# Patient Record
Sex: Male | Born: 1965 | Race: White | Hispanic: No | Marital: Married | Smoking: Current every day smoker
Health system: Southern US, Community
[De-identification: ages and names within clinical notes are randomized; demographics above are authoritative.]

## PROBLEM LIST (undated history)

## (undated) DIAGNOSIS — E119 Type 2 diabetes mellitus without complications: Secondary | ICD-10-CM

---

## 2007-10-09 ENCOUNTER — Ambulatory Visit: Payer: Self-pay | Admitting: Internal Medicine

## 2009-07-08 ENCOUNTER — Ambulatory Visit: Payer: Self-pay | Admitting: Family Medicine

## 2010-02-28 ENCOUNTER — Ambulatory Visit: Payer: Self-pay | Admitting: Internal Medicine

## 2010-09-26 ENCOUNTER — Ambulatory Visit: Payer: Self-pay | Admitting: Internal Medicine

## 2011-02-15 DIAGNOSIS — F32A Depression, unspecified: Secondary | ICD-10-CM | POA: Insufficient documentation

## 2011-02-15 DIAGNOSIS — F17200 Nicotine dependence, unspecified, uncomplicated: Secondary | ICD-10-CM | POA: Insufficient documentation

## 2011-03-02 DIAGNOSIS — K2270 Barrett's esophagus without dysplasia: Secondary | ICD-10-CM | POA: Insufficient documentation

## 2011-08-24 DIAGNOSIS — M549 Dorsalgia, unspecified: Secondary | ICD-10-CM | POA: Insufficient documentation

## 2011-08-29 IMAGING — US US EXTREM LOW VENOUS*L*
1 series · 17 of 24 positions shown · non-contrast
Comparison: none

REASON FOR EXAM: cr  [id number]  left leg pain and swelling eval for DVT
COMMENTS:

PROCEDURE:     [facility] LT LEG EXTR  - February 28, 2010  [DATE]
RESULT:     Comparison: None

[Series 1: us extrem low venous*left* · 17 of 28 slices shown]
[im 1/28]
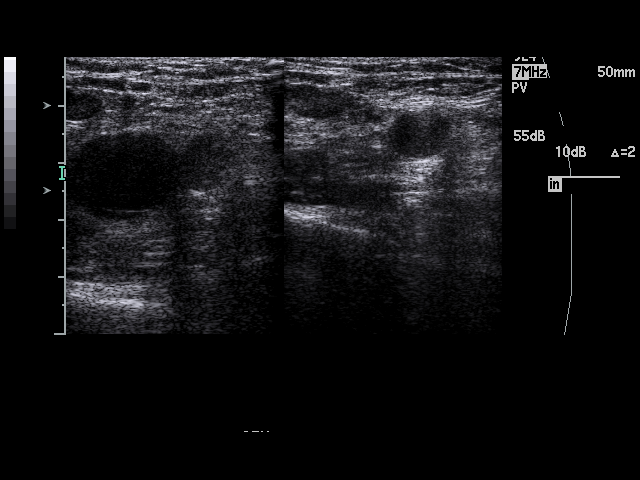
[im 3/28]
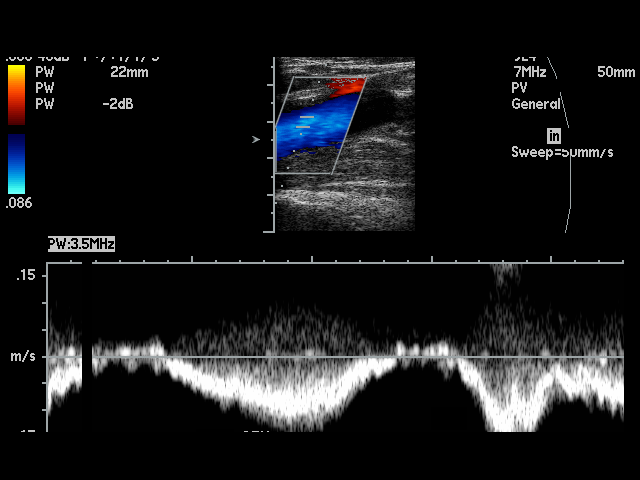
[im 4/28]
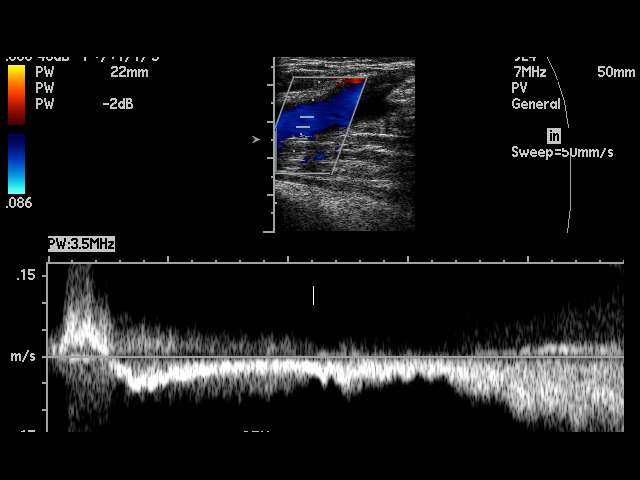
[im 5/28]
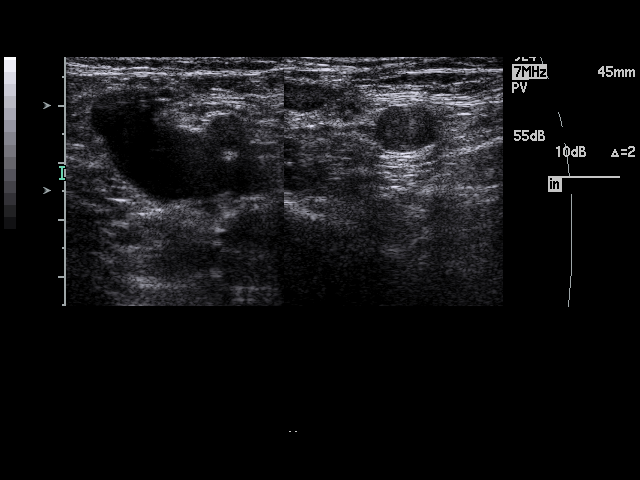
[im 8/28]
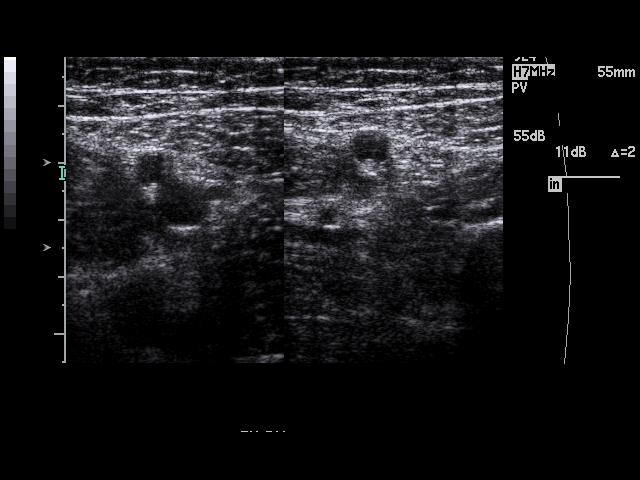
[im 9/28]
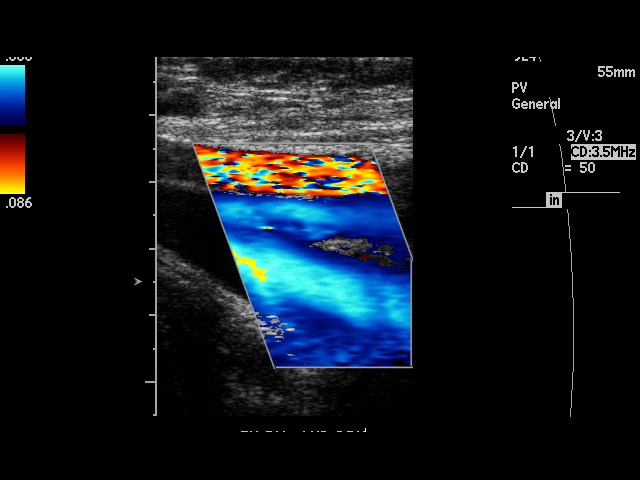
[im 11/28]
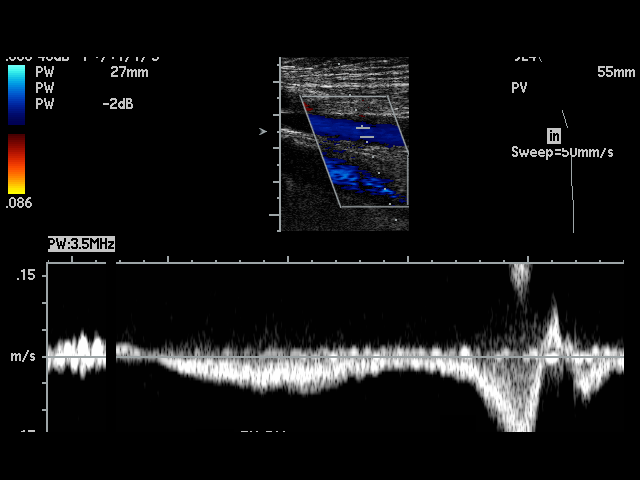
[im 12/28]
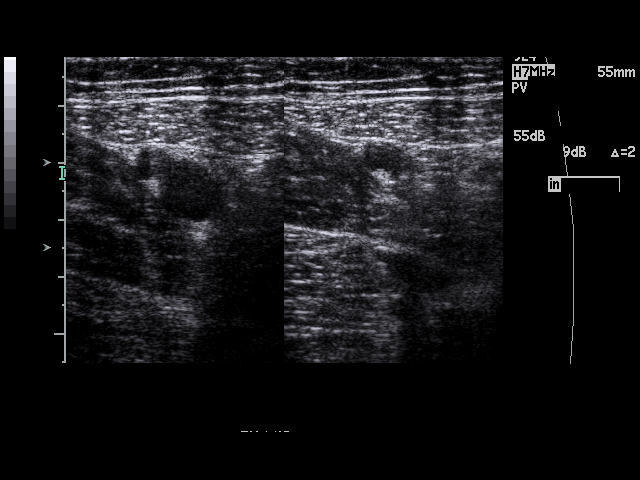
[im 15/28]
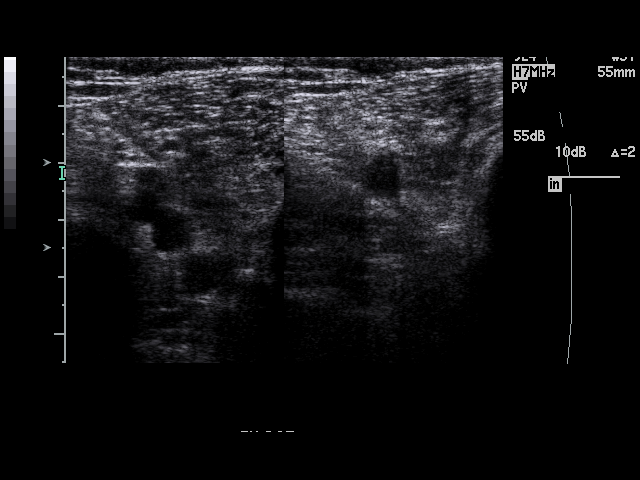
[im 16/28]
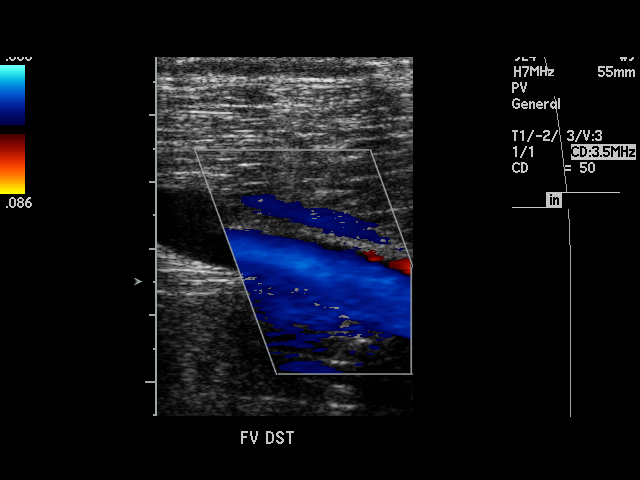
[im 17/28]
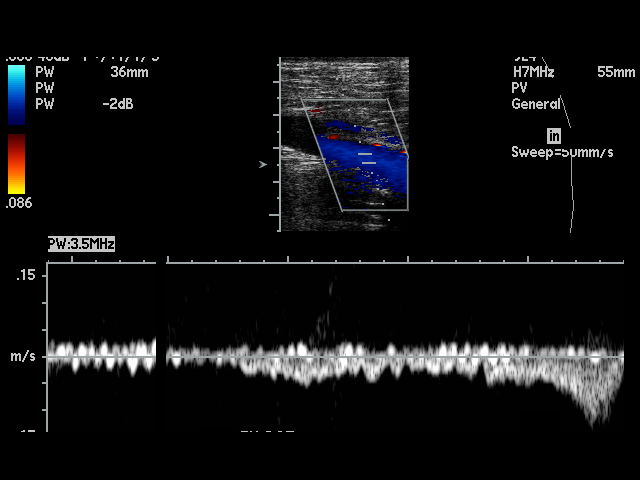
[im 19/28]
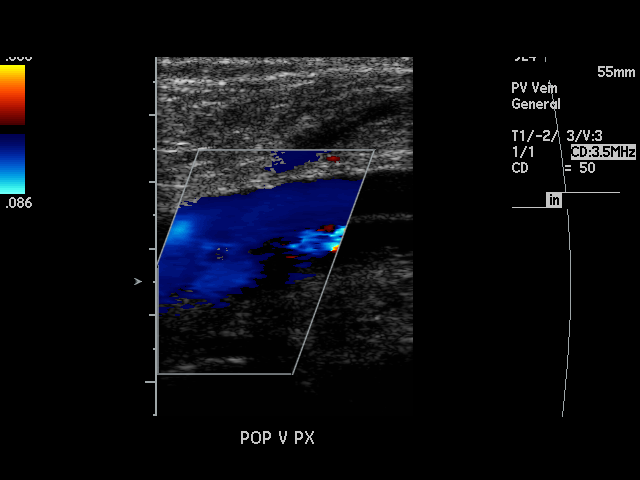
[im 20/28]
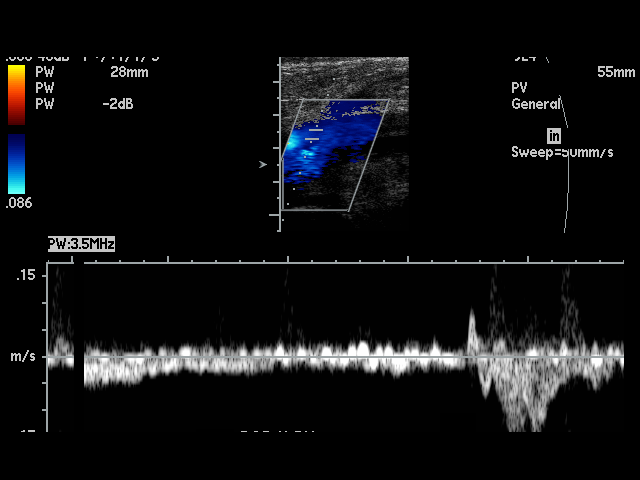
[im 23/28]
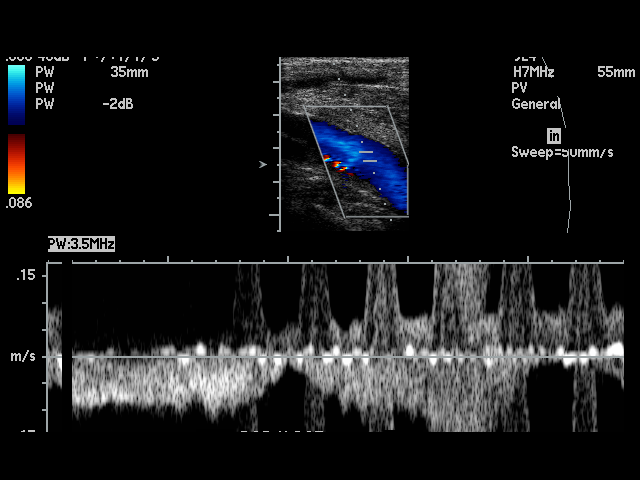
[im 24/28]
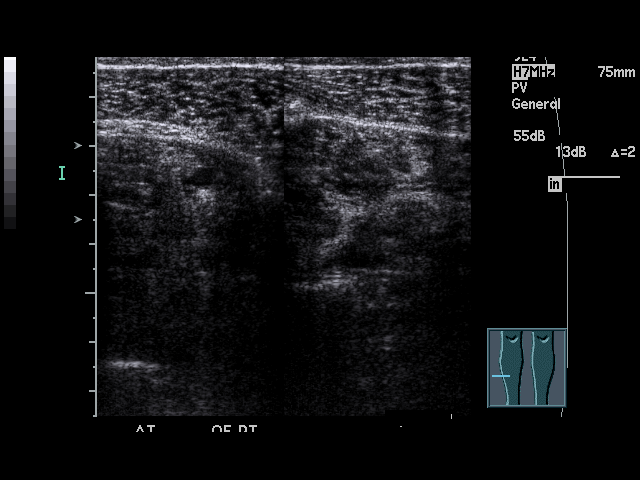
[im 25/28]
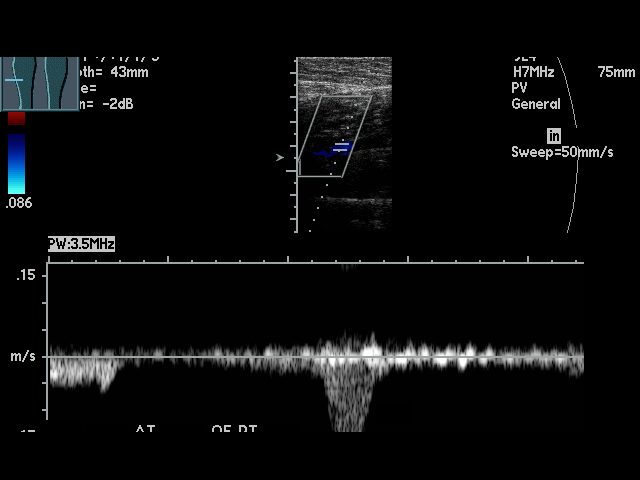
[im 28/28]
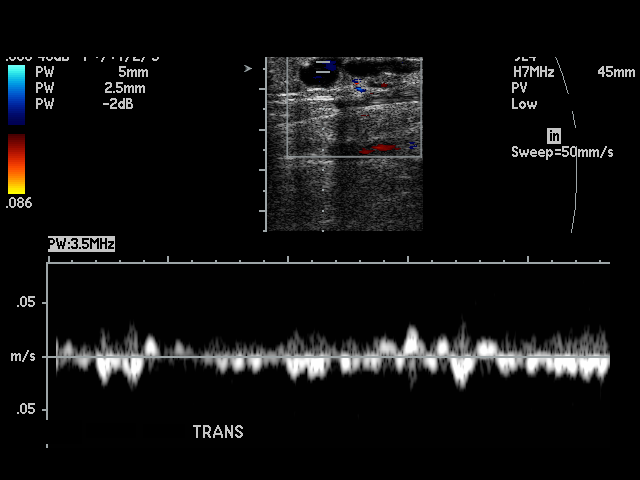

[17 of 24 positions shown; findings below may reference images not displayed]

FINDINGS: Multiple longitudinal and transverse gray-scale as well as color
and spectral Doppler images of the left lower extremity veins were obtained
from the common femoral veins through the popliteal veins.

The left common femoral, greater saphenous, femoral, popliteal veins, and
venous trifurcation are patent, demonstrating normal color-flow and
compressibility. No intraluminal thrombus is identified.There is normal
respiratory variation and augmentation demonstrated at all vein levels.
IMPRESSION: No evidence of DVT in the left lower extremity.

## 2011-09-23 ENCOUNTER — Ambulatory Visit: Payer: Self-pay | Admitting: Family Medicine

## 2013-04-05 DIAGNOSIS — G629 Polyneuropathy, unspecified: Secondary | ICD-10-CM | POA: Insufficient documentation

## 2014-07-13 ENCOUNTER — Ambulatory Visit: Payer: Self-pay | Admitting: Family Medicine

## 2017-01-21 ENCOUNTER — Encounter: Payer: Self-pay | Admitting: *Deleted

## 2017-01-21 ENCOUNTER — Ambulatory Visit
Admission: EM | Admit: 2017-01-21 | Discharge: 2017-01-21 | Disposition: A | Discharge status: Home / Self Care | Payer: Medicare Other | Attending: Family Medicine | Admitting: Family Medicine

## 2017-01-21 DIAGNOSIS — L0291 Cutaneous abscess, unspecified: Secondary | ICD-10-CM | POA: Diagnosis not present

## 2017-01-21 DIAGNOSIS — L0501 Pilonidal cyst with abscess: Secondary | ICD-10-CM

## 2017-01-21 DIAGNOSIS — L0231 Cutaneous abscess of buttock: Secondary | ICD-10-CM

## 2017-01-21 HISTORY — DX: Type 2 diabetes mellitus without complications: E11.9

## 2017-01-21 MED ORDER — MUPIROCIN 2 % EX OINT: 1.0000 "application " | TOPICAL_OINTMENT | Freq: Two times a day (BID) | CUTANEOUS | 0 refills | Status: AC

## 2017-01-21 MED ORDER — SULFAMETHOXAZOLE-TRIMETHOPRIM 800-160 MG PO TABS: 1.0000 | ORAL_TABLET | Freq: Two times a day (BID) | ORAL | 0 refills | Status: AC

## 2017-01-21 NOTE — ED Triage Notes (Signed)
Multiple abcesses to buttocks region x1 week.

## 2017-01-21 NOTE — ED Provider Notes (Signed)
[facility]    CSN: [id number] Arrival date & time: 01/21/17  0807     History   Chief Complaint Chief Complaint  Patient presents with  . Abscess    HPI Jowel Waltner II is a 51 y.o. male.   Patient is a 51 year old white male with history of diabetes type. He has informed me that he has multiple abscesses on his buttocks he was seen here before the abscess there are things well and draining. He states she's not sleeping well at night because of the abscess is keeping him up because the pain and discomfort. When asked how long the abscess up in their he reports having multiple some of been there for several months some for several weeks and one these particular concern about his been there for about a week. He denies any other medical problems states doesn't have increasing medical issues. Should be noted that his chart was reviewed he has a history of type 1 diabetes she finally did admit that he does have please also had issues of noncompliance apparently according to his chart and hospitalizations for this uncomplicated type 1 diabetes. Which is totally also has neuropathy as well. His current day smoker no known drug allergies. Going to him no pertinent family medical history this time.   The history is provided by the patient. No language interpreter was used.  Abscess  Location:  Pelvis Pelvic abscess location:  L buttock, R buttock, gluteal cleft and perianal Abscess quality: draining, induration and redness   Progression:  Worsening Chronicity:  New Context: diabetes   Relieved by:  Nothing Worsened by:  Nothing Ineffective treatments:  None tried   Past Medical History:  Diagnosis Date  . Diabetes mellitus without complication (HCC)     There are no active problems to display for this patient.   History reviewed. No pertinent surgical history.     Home Medications    Prior to Admission medications   Medication Sig Start Date End Date  Taking? Authorizing Provider  insulin glargine (LANTUS) 100 UNIT/ML injection Inject into the skin at bedtime.   Yes [provider]  insulin regular (NOVOLIN R,HUMULIN R) 100 units/mL injection Inject into the skin 3 (three) times daily before meals.   Yes [provider]  mupirocin ointment (BACTROBAN) 2 % Apply 1 application topically 2 (two) times daily. 01/21/17   Hassan Rowan, MD  sulfamethoxazole-trimethoprim (BACTRIM DS,SEPTRA DS) 800-160 MG tablet Take 1 tablet by mouth 2 (two) times daily. 01/21/17   Hassan Rowan, MD    Family History History reviewed. No pertinent family history.  Social History Social History  Substance Use Topics  . Smoking status: Current Every Day Smoker  . Smokeless tobacco: Never Used  . Alcohol use Yes     Allergies   Patient has no known allergies.   Review of Systems Review of Systems  Skin: Positive for wound.  All other systems reviewed and are negative.    Physical Exam Triage Vital Signs ED Triage Vitals  Enc Vitals Group     BP 01/21/17 0818 129/80     Pulse Rate 01/21/17 0818 90     Resp 01/21/17 0818 16     Temp 01/21/17 0818 (!) 97.5 F (36.4 C)     Temp Source 01/21/17 0818 Oral     SpO2 01/21/17 0818 100 %     Weight 01/21/17 0821 180 lb (81.6 kg)     Height 01/21/17 0821  (1.905 m)  Head Circumference --      Peak Flow --      Pain Score 01/21/17 0822 10     Pain Loc --      Pain Edu? --      Excl. in GC? --    No data found.   Updated Vital Signs BP 129/80 (BP Location: Left Arm)   Pulse 90   Temp (!) 97.5 F (36.4 C) (Oral)   Resp 16   Ht  (1.905 m)   Wt 180 lb (81.6 kg)   SpO2 100%   BMI 22.50 kg/m   Visual Acuity Right Eye Distance:   Left Eye Distance:   Bilateral Distance:    Right Eye Near:   Left Eye Near:    Bilateral Near:     Physical Exam  Constitutional: He is oriented to person, place, and time. He appears well-developed and well-nourished.  HENT:    Head: Normocephalic and atraumatic.  Eyes: Pupils are equal, round, and reactive to light. EOM are normal.  Neck: Normal range of motion.  Pulmonary/Chest: Effort normal.  Musculoskeletal: Normal range of motion.  Neurological: He is alert and oriented to person, place, and time.  Skin: Lesion noted. There is erythema.     Patient with multiple abscesses on his buttocks one on the right lower cheek that appears to be draining even states that he can squeezes stuff comes out the abscess that he's most concerned about an most upset about his want this on the tailbone between both buttocks. This one is red there is a purulent discharge coming from it and is definitely open and draining.  Psychiatric: His mood appears anxious.  Vitals reviewed.    UC Treatments / Results  Labs (all labs ordered are listed, but only abnormal results are displayed) Labs Reviewed - No data to display  EKG  EKG Interpretation None       Radiology No results found.  Procedures Procedures (including critical care time)  Medications Ordered in UC Medications - No data to display   Initial Impression / Assessment and Plan / UC Course  I have reviewed the triage vital signs and the nursing notes.  Pertinent labs & imaging results that were available during my care of the patient were reviewed by me and considered in my medical decision making (see chart for details).    Explained to him concerned that he has a pollen out abscess this is the abscess that he really wants to have the open up and drain is really nothing at this point time to open since his draining is not fluctuant. Mildly concerned though courses of this may be much deeper and apparently is and recommend surgical referral. He has informed me he does not want to see a surgeon surge tech on doing anything for him he wants something done today she is going to [place] for this weekend. Even though this is been going on for about anywhere  from a month to a couple weeks he feels it has been done today and his son go to the ED of his choice to get something done   [facility] call in a prescription for Septra DS and Bactroban ointment Final Clinical Impressions(s) / UC Diagnoses   Final diagnoses:  Pilonidal abscess  Abscess  Abscess of buttock, right    New Prescriptions New Prescriptions   MUPIROCIN OINTMENT (BACTROBAN) 2 %    Apply 1 application topically 2 (two) times daily.   SULFAMETHOXAZOLE-TRIMETHOPRIM (BACTRIM DS,SEPTRA DS)  800-160 MG TABLET    Take 1 tablet by mouth 2 (two) times daily.   Note: This dictation was prepared with Dragon dictation along with smaller phrase technology. Any transcriptional errors that result from this process are unintentional.  Controlled Substance Prescriptions [place] Controlled Substance Registry consulted? Not Applicable   Hassan Rowan, MD 01/21/17 [id number]

## 2017-07-28 DIAGNOSIS — E1121 Type 2 diabetes mellitus with diabetic nephropathy: Secondary | ICD-10-CM | POA: Insufficient documentation

## 2017-07-28 DIAGNOSIS — E1021 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: Secondary | ICD-10-CM | POA: Insufficient documentation

## 2021-12-18 DIAGNOSIS — Z89431 Acquired absence of right foot: Secondary | ICD-10-CM | POA: Insufficient documentation

## 2021-12-26 DIAGNOSIS — K219 Gastro-esophageal reflux disease without esophagitis: Secondary | ICD-10-CM | POA: Insufficient documentation

## 2022-01-29 DIAGNOSIS — Z89512 Acquired absence of left leg below knee: Secondary | ICD-10-CM | POA: Insufficient documentation

## 2022-11-05 DIAGNOSIS — D649 Anemia, unspecified: Secondary | ICD-10-CM | POA: Insufficient documentation

## 2023-11-01 DIAGNOSIS — M21941 Unspecified acquired deformity of hand, right hand: Secondary | ICD-10-CM | POA: Insufficient documentation

## 2024-01-26 DIAGNOSIS — I10 Essential (primary) hypertension: Secondary | ICD-10-CM | POA: Insufficient documentation

## 2024-02-15 DIAGNOSIS — N186 End stage renal disease: Secondary | ICD-10-CM | POA: Insufficient documentation

## 2024-02-15 NOTE — Discharge Summary (Signed)
 [facility] Medicine Discharge Summary  Admit Date: 02/07/2024 Discharge Date: 02/15/2024  2:31 PM  Admitting Physician: Gara Door, MD Discharge Physician: Gordy Code, MD  Primary Care Provider: None (Inactive), Phone None  Discharge Destination: Home  Admission Diagnoses:  Hypertensive emergency [I16.1] Chest pressure [R07.89]  Discharge Diagnoses:  Principal Problem (Resolved):   NSTEMI (non-ST elevated myocardial infarction) (CMS/HHS-HCC) Active Problems:   Tobacco use disorder   Depression   Back pain   Poorly controlled type 1 diabetes mellitus (CMS/HHS-HCC)   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 (CMS/HHS-HCC)   Acute kidney injury superimposed on chronic kidney disease   Mild protein-calorie malnutrition (HHS-HCC)   History of transmetatarsal amputation of left foot (CMS/HHS-HCC)   History of transmetatarsal amputation of right foot (CMS/HHS-HCC)   GERD (gastroesophageal reflux disease)   Below-knee amputation of left lower extremity (CMS/HHS-HCC)   ESRD (end stage renal disease) on dialysis (CMS/HHS-HCC) Resolved Problems:   CKD (chronic kidney disease) stage 4, GFR 15-29 ml/min (CMS/HHS-HCC)   Elevated troponin   Chest pressure   Hyperglycemic hyperosmolar nonketotic coma (CMS/HHS-HCC)   Hypertensive emergency   Abscess of groin, left  Primary Diagnosis: Admitted for Type I NSTEMI  Changes Made (with rationale):  Started on amoxicillin-clavulanate and doxycycline for 7 day total course for L groin SSTI Started on carvedilol for GDMT given HFrEF Started on ASA and Plavix for DAPT following NSTEMI with DES x 1 placement clopidogreL (PLAVIX)  Started on diclofenac for trigger finger pain Started on EPO with iHD for anemia 2/2 ESKD  Increased sevelamer to 1600 TID for hyperphosphatemia iso ESKD Changed lispro to 14-14-18 for mealtime glucose control with reduction in basal insulin glargine to 15 U  QHS Started lidocaine patches  for lower back pain Started torsemide for volume overload and diuresis on non-iHD days to prevent reaccumulation of fluid Increased atorvastatin from 40 to 80 mg QHS for ACS/NSTEMI and HLD Increased hydralazine to 50 mg TID and isordil to 30 mg TID for GDMT and HTN control Stopped regular insulin iso ESKD, replaced with glargine Stopped metoprolol due to preferential start of carvedilol iso HTN  To-Do List (incidental findings, follow-up studies, etc.): Follow-up made with PCP for medication renewal and L groin SSTI reevaluation (patient declined with scheduler to make this appointment) Follow-up with cardiology for HFrEF and CAD managmenet CBC, RFP in 1 week Follow-up culture data from I&D L groin abscess  Anticipatory Guidance for Outpatient Care:  Patient has repeatedly deferred outpatient follow-up with providers likely due to general mistrust of medical system; there is concern that he may not refill essential medications as an outpatient including his DAPT May need more aggressive volume removal during iHD sessions; high concern that he may self discontinue his home diuretic in the outpatient setting If possible, patient really needs to establish with PCP and this should be reiterated at any outpatient appointments that he attends    Results Pending at Discharge:  Microbiology: I&D culture from 10/20 Please see phone numbers at end of this summary for lab contact information.   Follow-up/Care Transition Plan: Sched. appts: Future Appointments  Date Time Provider Department Center  02/23/2024  2:00 PM Hershal Lynwood Cough, MD [facility]  02/28/2024  8:20 AM Joleen, Wynell Ley, MD [facility] [place]    Follow-up info: None  Follow up       Allergies/Intolerances:  Allergies  Allergen Reactions  . Percocet [Oxycodone-Acetaminophen] Nausea     New Adverse Drug Events: none  Medications:  Current Discharge Medication List     PAUSE taking these  medications      Instructions  sodium bicarbonate 650 MG tablet Wait to take this until your doctor or other care provider tells you to start again. Quantity: 180 tablet Refills: 0 Ask about: Should I take this medication?  Take 2 tablets (1,300 mg total) by mouth 3 (three) times daily for 30 days Last time this was given: 1,300 mg on February 10, 2024  7:48 AM       START taking these medications      Instructions  amoxicillin-clavulanate 500-125 mg tablet Quantity: 6 tablet Refills: 0 Stop taking on: February 21, 2024  Commonly known as: AUGMENTIN Take 1 tablet (500 mg total) by mouth daily with dinner for 6 days For: Skin/Soft Tissue Last time this was given: 500 mg on February 14, 2024  6:02 PM   carvediloL 6.25 MG tablet Quantity: 60 tablet Refills: 0  Commonly known as: COREG Take 1 tablet (6.25 mg total) by mouth every 12 (twelve) hours Last time this was given: 6.25 mg on February 15, 2024 12:39 PM   clopidogreL 75 mg tablet Quantity: 90 tablet Refills: 0 Start taking on: February 16, 2024  Commonly known as: PLAVIX Take 1 tablet (75 mg total) by mouth once daily Last time this was given: 75 mg on February 15, 2024 12:39 PM   diclofenac 1 % topical gel Quantity: 100 g Refills: 0  Commonly known as: VOLTAREN Apply 2 g topically 4 (four) times daily as needed (for R trigger finger pain) Last time this was given: 2 g on February 13, 2024  8:33 AM   doxycycline monohydrate 100 mg Tab tablet Quantity: 12 tablet Refills: 0 Stop taking on: February 21, 2024  Take 1 tablet (100 mg total) by mouth every 12 (twelve) hours for 6 days For: Skin/Soft Tissue Last time this was given: 100 mg on February 15, 2024 12:39 PM   epoetin alfa-epbx 4,000 unit/mL injection Refills: 0  Commonly known as: RETACRIT Inject 1.25 mLs (5,000 Units total) into the vein Dialysis Last time this was given: Ask your nurse or doctor   insulin LISPRO pen injector (concentration 100  units/mL) Quantity: 15 mL Refills: 0  Commonly known as: HumaLOG KWIKPEN Inject 14 Units subcutaneously Daily after breakfast AND 18 Units Daily after lunch AND 18 Units daily after dinner. Doctor's comments: May switch between lispro and aspart and lispro-aabc (or between HumaLOG and Admelog and Lyumjev) per insurance preference. Last time this was given: Ask your nurse or doctor   lidocaine 4 % patch Quantity: 30 patch Refills: 0 Start taking on: February 16, 2024  Commonly known as: SALONPAS Place 1 patch onto the skin daily for 30 days Apply patch to the most painful area for up to 12 hours in a 24 hours period. Last time this was given: Ask your nurse or doctor   pen needle, diabetic 31 gauge x 3/16 needle Quantity: 100 each Refills: 0 Replaces: pen needle, diabetic 32 gauge x 5/32 Ndle  Use as directed Doctor's comments: Needle length per patient preference or history.   TORsemide 20 MG tablet Quantity: 48 tablet Refills: 0 Start taking on: February 17, 2024  Commonly known as: DEMADEX Take 3 tablets (60 mg total) by mouth 4 (four) times a week Only take on the days you do not have dialysis. Last time this was given: 60 mg on February 13, 2024  8:32 AM       These  medications have been CHANGED      Instructions  atorvastatin 80 MG tablet Quantity: 30 tablet Refills: 0 Start taking on: February 16, 2024 What changed:  medication strength how much to take  Commonly known as: LIPITOR Take 1 tablet (80 mg total) by mouth once daily Last time this was given: 80 mg on February 15, 2024 12:40 PM   hydrALAZINE 50 MG tablet Quantity: 90 tablet Refills: 0 What changed:  medication strength how much to take when to take this  Commonly known as: APRESOLINE Take 1 tablet (50 mg total) by mouth every 8 (eight) hours Last time this was given: 50 mg on February 14, 2024 11:22 PM   insulin GLARGINE injection (concentration 100 units/mL) Quantity: 10 mL Refills: 0 What  changed: how much to take  Commonly known as: LANTUS Inject 15 Units subcutaneously once daily For: type 1 diabetes mellitus   isosorbide dinitrate 30 MG tablet Quantity: 90 tablet Refills: 0 What changed:  medication strength how much to take  Commonly known as: ISORDIL Take 1 tablet (30 mg total) by mouth 3 (three) times daily Last time this was given: 30 mg on February 15, 2024 12:40 PM       CONTINUE taking these medications      Instructions  aspirin 81 MG EC tablet Quantity: 30 tablet Refills: 0  Take 1 tablet (81 mg total) by mouth once daily Last time this was given: 81 mg on February 15, 2024 12:40 PM   blood glucose diagnostic test strip Quantity: 100 each Refills: 3  Use to check 3 times a day for blood glucose. Doctor's comments: Dispense for Accu-check, One Touch, freestyle, Reli-On   blood glucose meter kit Quantity: 1 each Refills: 0  as directed Dispense the brand covered by insurance   ferrous sulfate 325 (65 FE) MG EC tablet Quantity: 15 tablet Refills: 0  Take 1 tablet (325 mg total) by mouth every other day Last time this was given: Ask your nurse or doctor   FREESTYLE LIBRE 3 SENSOR Quantity: 2 kit Refills: 11  Use 1 each every 14 (fourteen) days for glucose monitoring   lactobacil rhamnosus (GG)-inulin 10 billion cell - 200 mg capsule Quantity: 30 capsule Refills: 0  Commonly known as: CULTURELLE Take 1 capsule by mouth once daily   lancets Quantity: 100 each Refills: 3  Use 3 times a day for glucose mornitoring. Doctor's comments: Dispense for Accu-check, One Touch, freestyle, Reli-On   sevelamer carbonate 0.8 gram powder packet Quantity: 90 packet Refills: 0  Commonly known as: RENVELA Take 1 packet (800 mg total) by mouth 3 (three) times daily with meals for 30 days Last time this was given: 1,600 mg on February 15, 2024 12:42 PM       STOP taking these medications    insulin REGULAR injection (concentration 100  units/mL) Commonly known as: HumuLIN R   metoprolol SUCCinate 25 MG XL tablet Commonly known as: TOPROL-XL   pen needle, diabetic 32 gauge x 5/32 Ndle Commonly known as: BD ULTRA-FINE NANO PEN NEEDLE Replaced by: pen needle, diabetic 31 gauge x 3/16 needle         Brief History of Present Illness:  Per PA [place]: Bradley Fletcher is a 58 y.o. male with a previous medical history of  T1DM, PAD sp L BKA, CKD, tobacco use, ICM c/b HFrEF (40%), recent NSTEMI and CVA (9/25) w/ c/f cardiac syncope presenting with NSTEMI, shortness of breath, HTN, and HHS/DKA.   Information  mainly taken from patient's daughter Rico who reports that patient was complaining of chest pressure (an elephant sitting on him) and shortness of breath this morning.  He was endorsing nausea, drooling, and diaphoresis and unfortunately missed his insulin doses this morning.  Due to him continually clinically decline family called 911 and patient was transferred via EMS.   Of note, he was recently admitted to [facility] at the end of September for T1 NSTEMI. He did not undergo LHC because of AKI with cr of 9.1 and was managed medically with plan for ischemic eval as an outpatient.   While in the ED labs notable for VBG 7.19/38/15 and glucose >720, WBC 17.7, hgb 7.5 (bl 8-9), troponin 1,590 (from 701 1 mo prior), Cr 8.5, Na 125. He was started on a nitroglycerin gtt --> nicardipine gtt and placed on BiPAP for work of breathing. He was started on an insulin gtt. Repeat troponin was 2,703 with delta of 1113.  Given the rising troponin and symptomatic cardiac presentation cardiology was consulted given concern for possible type I event.  Patient was placed on BiPAP for increased work of breathing and transferred to the MICU for further management care. _____________________   Hospital Course by Problem:   #Type I NSTEMI s/p PCI to RCA #HFrEF (EF 40%) #Hypertensive Emergency, resolved Presented with significant shortness  of breath, hypertension, and chest pain, initially admitted to MICU on BiPAP. BP elevated on presentation to 180/110s and he was initially started on nitroglycerin gtt with transition to nicardipine gtt. He was also found to have anteroseptal ischemic changes on EKG with concomitant rise in troponin from 1580 to 2703. He was ASA loaded and started on heparin gtt. Cardiology was consulted and patient was taken for PCI on 10/16, found to have 99% occlusion of mid RCA s/p PTCA/DES x 1. He was subsequently started on DAPT with ASA and Plavix with recommendation to continue DAPT for 1 year. He also had signs of volume overload requiring bumetanide gtt while in the MICU. Unfortunately, he developed worsening renal function and required initiation of iHD while inpatient, but continued to make urine with aggressive diuresis on non-HD days. GDMT advanced while inpatient, but limited due to underlying T1DM and new HD start. Patient was discharged carvedilol and isosorbide dinitrate and hydralazine TID. He was counseled on the importance of DAPT compliance and adherence to GDMT. Tobacco cessation counseling and pharmacotherapies offered, but patient declined.   #Type 1 DM #HHS, resolved Patient with Type I DM with poorly controlled disease. On presentation, he had glucose elevation to greater than 720, VBG 7.19/38/15. Beta-Hydroxybutyrate was monitored but remained non-elevated over serial measures. He was started on IV insulin with resolution of hyperglycemia. Endocrinology was consulted and followed through the course of hospitalization. He was discharged on insulin glargine 15 U QHS with 14-18-18 insulin aspart at mealtimes.  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 S/p permacath placement with IR initiation of iHD while inpatient with good tolerance. He had significant improvement in his edema and discharged  with plan for  TTS iHD in the outpatient setting. Notably, he was still making some urine and diuretic responsive with plan for PO torsemide on non iHD days. EPO also scheduled with iHD due to concomitant anemia.  #Purulent L groin abscess s/p I&D Patient developed persistent leukocytosis during hospitalization initially without clear source and thought to be reactive iso HD and PCI. At the  time, he was having loose stools and workup was started for infectious diarrhea. UA was unremarkable and no respiratory symptoms or imaging concerning for pneumonia.  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 CT AP demonstrated soft tissue edema in this area and additionally concerning for pyelonephritis. With negative UA, no urinary symptoms or systemic signs of infection, and no flank tenderness, pyelonephritis was felt to be less likely. Surgery consulted and performed I&D which showed purulent drainage from groin site. Patient was started on amoxicillin/clavulanate and doxycycline with subsequent improvement of leukocytosis and improved appearance of groin site. Given the superficial nature of wound, surgery did not recommend further follow-up. Plan for 7 day total course of antibiotics with PCP follow-up. Cultures from I&D pending at time of discharge.  #PAD #Hx osteomyelitis #R TMA  #L AKA S/p amputation of all toes on right foot; s/p BKA of left lower ext due to osteomyelitis approx 2 years ago and required revision. No complications while inpatient.   #R 4th digit trigger finger Limited mobility and intermittent numbness and pain. Was scheduled for ortho eval but missed due to hospitalizations. Recommend ortho f/u for consideration of intervention.  Abnormal troponin: He had an abnormal high-sensitivity troponin panel. Based on my review of all clinical and laboratory information, the abnormal troponin most likely reflects myocardial infarction as  evidenced by ischemic findings (chest pain or anginal equivalent, ECG ischemic changes, and cardiac catheterization findings), representing type 1 MI (STEMI, NSTEMI, ACS).     Social Drivers of Health with Concerns   Tobacco Use: High Risk (01/27/2024)   Patient History   . Smoking Tobacco Use: Every Day   . Smokeless Tobacco Use: Never  Financial Resource Strain: High Risk (02/08/2024)   Overall Financial Resource Strain (CARDIA)   . Difficulty of Paying Living Expenses: Hard  Food Insecurity: Food Insecurity Present (02/08/2024)   Hunger Vital Sign   . Worried About Programme Researcher, Broadcasting/film/video in the Last Year: Sometimes true   . Ran Out of Food in the Last Year: Sometimes true  Physical Activity: Inactive (06/28/2019)   Exercise Vital Sign   . Days of Exercise per Week: 0 days   . Minutes of Exercise per Session: 0 min  Depression: Moderately severe depression (09/22/2022)   PHQ-9   . PHQ-9 Score: 17    Surgeries and Procedures Performed:  Right internal jugular vein tunneled central venous catheter placement by IR on 10/15 LHC with PCI to mid RCA, PTCA/DES x 1 on 10/16  _____________________  Discharge Exam:  BP (!) 162/91 (BP Location: Left upper arm, Patient Position: Lying)   Pulse 109   Temp 36.9 C (98.5 F) (Oral)   Resp 18   Ht 190.5 cm (6' 3)   Wt 94.3 kg (208 lb)   SpO2 96%   BMI 26.00 kg/m  O2 Device: Nasal cannula (02/11/24 2302) GEN: alert, sitting in bed, comfortable-appearing, in no acute distress HEENT: normal appearing conjunctiva, anicteric sclerae, clear OP, MMM CV: RRR no m/r/g RESP: CTAB   ABD: soft, nontender, nondistended, normoactive BS EXT: strong radial pulses, left BKA, right TMA; flexed R 4th and 5th digits with minimal mobility of those fingers, minimal pedal edema bilaterally GU: moderate scrotal; L groin I&D packed, erythema and tenderness significantly ipmroved SKIN: no rashes, healing wounds on R 4th and 5th fingers and R shin NEURO: grossly  normal   Pertinent Lab Testing: Recent Labs  Lab 02/13/24 0416 02/14/24  9372 02/15/24 0809  NA 134* 130* 134*  K 3.8 3.8 4.1  CL 99 95* 99  CO2 23 20* 24  BUN 42* 47* 35*  CREATININE 4.7* 5.2* 4.2*  GLUCOSE 126 240* 146*  CALCIUM 7.4* 7.4* 7.0*   No results for input(s): AST, ALT, ALKPHOS, TBILI in the last 168 hours.  Recent Labs  Lab 02/13/24 0416 02/14/24 0627 02/15/24 0809  WBC 17.5* 16.5* 14.2*  HGB 9.4* 9.5* 7.9*  HCT 29.2* 29.7* 25.1*  PLT 305 332 300   Recent Labs  Lab 02/09/24 0107 02/09/24 0525 02/10/24 1834  APTT 23.0*   < > 24.5*  INR 1.0  --   --    < > = values in this interval not displayed.     Other Pertinent Labs:  Troponin: 1500->2100->2700 (peak) NT-Pro-BNP: 92,000  Micro:  No results found for the last 90 days.      Pertinent Imaging:   CT abdomen pelvis with contrast Result Date: 02/14/2024 CT abdomen and pelvis with IV contrast Comparison:  October 23, 2018. Indication:  Abdominal abscess/infection suspected, I27.176 Leukemoid reaction. Technique:  CT imaging was performed of the abdomen and pelvis following the administration of intravenous contrast.  Iodinated contrast was used due to the indications for the examination, to improve disease detection and further define anatomy. Coronal and sagittal reformatted images were generated and reviewed. Findings: - Lower Thorax: Atelectasis is present in the lower lobes. Large bilateral pleural effusions, partially imaged. - Liver: Normal in morphology and enhancement.  No suspicious hepatic masses are identified.  The portal and hepatic veins are patent. - Biliary and Gallbladder: No intrahepatic or extrahepatic bile duct dilatation. Normal gallbladder. - Spleen: Normal in appearance.  - Pancreas: Normal in appearance. - Adrenal Glands: Normal in appearance. - Kidneys: Mildly heterogeneous enhancement of the bilateral renal cortices for example in the left upper pole (601/74). No hydronephrosis.  - Abdominal and Pelvic Vasculature: No abdominal aortic aneurysm. Moderate atherosclerosis. - Gastrointestinal Tract: No abnormal dilation or wall thickening. Normal appendix. - Peritoneum/Mesentery/Retroperitoneum: Small volume ascites.  No free intraperitoneal air. - Lymph Nodes: No retroperitoneal, mesenteric, pelvic, or inguinal lymphadenopathy.  - Bladder: Normal in appearance. - Pelvic Organs: Unremarkable. - Body Wall: Anasarca.Partially imaged soft tissue asymmetry in the left perianal region (2/199). - Musculoskeletal:  No aggressive appearing osseous lesions. Impression: 1.  Heterogeneous renal enhancement, may reflect pyelonephritis in the appropriate clinical setting. Correlate with urinalysis. 2.  Partially imaged soft tissue in the left perianal region, may correlate with history of pilonidal disease versus partially imaged abscess or hematoma. Correlate with exam and if clinically indicated consider rescanning to include the low pelvis. 3.  Findings of volume overload anasarca and large bilateral pleural effusions and small volume ascites. Concern for pyelonephritis messaged to Dr. Joann by Dr. Racheal at 8:45 AM on 02/14/24. Electronically Signed by:  Edsel Racheal, MD, [facility] Electronically Signed on:  02/14/2024 8:49 AM  IR central venous catheter placement Result Date: 02/10/2024 EXAM: Tunneled central venous catheter placement INDICATION: Permcath placement for HD initiation AKI (acute kidney injury) [N17.9 (ICD-10-CM)] SEDATION / ANESTHESIA / MEDICATION: Level of sedation/anesthesia: Moderate sedation  - the physician who performed the diagnostic/therapeutic procedure provided 57 minutes of continuous face to face moderate sedation services for this procedure with the assistance of an independent trained observer who had no other duties during the procedure.  Sedation / Anesthesia / Medication Administered:  fentaNYL (PF) (SUBLIMAZE) injection - 50 mcg ceFAZolin (ANCEF) 2 g in  sodium  chloride 0.9 % 100 mL IVPB - 2 g ondansetron (PF) (ZOFRAN) injection - 4 mg (Totals for administrations occurring from 1300 to 1427 on 02/09/24) FLUOROSCOPY DOSE: Total fluoroscopy time: 2.1 minutes Dose area product: 4.96 mGy-cm2 Air kerma: 12 mGy COMPLICATIONS: None immediate PREPROCEDURE: Informed written consent was obtained after a discussion of the risks, benefits, and alternatives to the procedure. A pre-procedure assessment was documented in the medical record. A Time-Out was performed immediately prior to the procedure. TECHNIQUE AND FINDINGS: The patient was positioned supine on the fluoroscopy table. The procedure site was prepped and draped in the usual sterile fashion. The target vein was visualized by ultrasound and found to be patent. An image was saved and sent to PACS. After anesthetizing the overlying skin and subcutaneous tissues with lidocaine, the vein was accessed under real-time ultrasound guidance using a micropuncture kit. A guidewire was advanced into the central veins under fluoroscopic guidance. A subcutaneous tract was anesthetized with lidocaine, and using a tunneling device the catheter was tunneled through the subcutaneous tissues and out the venotomy site. A peel-away sheath was advanced over the guidewire. The catheter was inserted through the peel-away sheath. A radiograph confirmed appropriate location of the catheter tip at the cavoatrial junction / right atrium. The catheter aspirated and flushed without difficulty. The catheter was anchored to the skin surface using a non-absorbable suture. The venotomy site was closed with skin glue.  The patient tolerated the procedure well.    Successful placement of a right internal jugular vein 23 cm tip to cuff 14.5 French [employer] hemodialysis tunneled central venous catheter. The catheter is ready for use. Electronically reviewed by: NATHAN JACOB BRAJER, MD I, Colby Ashdown, MD, MPH, attest that I was present for the key  elements of the procedure and immediately available.  I reviewed the stored images and concur with the above findings.   CATH coronary angiography (coronary arteries only) Result Date: 02/10/2024 Impressions: 1. Single vessel obstructive CAD - mid RCA 99% 2. LM: normal 3. LAD: mid 20%, distal 30% 4. LCx: OM1 30% 5. RCA: mid 99% 6. Successful OCT-guided PTCA/DES x 1 to mid RCA (3.0 x 24 Synergy DES post-dilated to 3.26mm with high-pressure [place] balloon) Recommendations: 1. D/C right radial TR band per protocol - (of note, catheters had a relatively tight fit going up R arm so would consider femoral access for subsequent procedures) 2. DAPT with ASA/Plavix x 1 year 3. Statin for CAD 4. Continue care per primary team Discussed with Dr. EMERSON Mort   Echo complete Result Date: 02/08/2024               [facility]                         [place]                                                                                  [place]                            [facility]  [id number]                                                                               DOB: 1965/06/22  Age: 58                  ECHO-DOPPLER REPORT                             Date: 02/08/2024                                                                                Male                                                                                      Inpatient                                                                       LOCATION: Patient Room                                                                         MD1: WILLIAM WALKER                                                                            PHILLIPS                     STUDY: ECHO COMPLETE                             SOUND QLTY: Moderate                      ECHO: Yes  STRAIN: Yes                          COLOR: Yes                                               3D: Yes                         DOPPLER: Yes                                               BP: 149 / 69                 RV BIOPSY: No                                                HR: 80 BPM                    CONTRAST: No                                            Height: 75 in                       MEDIUM: N/A                                           Weight: 194 lbs                    MACHINE: CDU - E95-7                                      BSA: 2.2                   ------------------------------------------------------------------------------------------     History: Chest Pain and Elevated Troponin     Reason: Assess LV function  Indication: R07.89- Other chest pain.                                                           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 ESTIMATED EF: 40%, CALC EF(3D): 40% ELEVATED LA PRESSURES WITH DIASTOLIC DYSFUNCTION (GRADE 2) NORMAL RIGHT VENTRICULAR SYSTOLIC FUNCTION VALVULAR REGURGITATION: No AR, MILD MR, TRIVIAL PR, TRIVIAL TR                           ESTIMATED RVSP: 46 mmHg (ABNORMAL) NO VALVULAR STENOSIS  Compared with prior Echo study on 01/06/2024 NO SIGNIFICANT CHANGES PLEURAL EFFUSION ON TODAY'S EXAM                                                         NO SIGNIFICANT CHANGES IN LV FUNCTION WITH LOWER HEART RATE ON TODAY'S EXAM              ECHOCARDIOGRAPHIC DESCRIPTIONS ----------------------------------------------------------- AORTIC ROOT         Asc Ao Size: MILDLY DILATED                       Dissection: INDETERMINATE FOR DISSECTION                                             AORTIC VALVE            Leaflets: Tricuspid                               Mobility: Fully Mobile                    Morphology: MILDLY THICKENED                                                                          AR:  No AR                                         AS: No AS                              AV Mass: No Masses                   LEFT VENTRICLE                Size: Normal                                                                                   LVH: MILD LVH                            Contraction: MODERATE DECREASE                    Closest EF: 40%  Calc. EF: 40%(3D)                        LV GLS (GE): -10.5%   Normal Range <-18%     Strain Analysis: GLS > -16% Global longitudinal strain is abnormal.                                 LV Mass: No Masses                         Dias. FxClass: RELAXATION ABNORMALITY (GRADE 2) CORRESPONDS TO PSEUDONORMAL             WALL MOTION                            Basal             Mid               Apical              Anterior Septum: Hypokinetic       Hypokinetic       Hypokinetic           Anterior Wall: Hypokinetic       Hypokinetic       Hypokinetic            Lateral Wall: Hypokinetic       Hypokinetic       Hypokinetic          Posterior Wall: Hypokinetic       Hypokinetic                             Inferior Wall: Hypokinetic       Hypokinetic       Hypokinetic         Inferior Septum: Hypokinetic       Hypokinetic                       Rest Rest Score Index: 2.00 MITRAL VALVE            Leaflets: Normal                                  Mobility: Fully Mobile                    Morphology: THICKENED LEAFLET(S)                                                                       MR: MILD MR                                       MS: No MS                            MV masses: No Masses  LEFT ATRIUM                Size: SEVERELY ENLARGED                                                                  LA masses: No Masses                   MAIN PA                Size: MILDLY DILATED                          Diameter: 2.4 cm                 PULMONIC VALVE            Leaflets: UNKNOWN                                  Mobility: Fully Mobile                    Morphology: Normal                                                           PR: TRIVIAL PR                                    PS: No PS                            PV masses: No Masses                   RIGHT VENTRICLE                Size: MODERATELY ENLARGED                    Free Wall: Normal                         Contraction: Normal                                                        TAPSE: 2.6 cm                                                    RV masses: No Masses                   TRICUSPID VALVE  Leaflets: Normal                                  Mobility: Fully Mobile                    Morphology: Normal                                       TR: TRIVIAL TR                                    TS: No TS                            TV masses: No Masses                   RIGHT ATRIUM                Size: Normal                                RA masses: No Masses                   PERICARDIUM               Fluid: TRIVIAL FLUID        ANTERIOR                             Pleural Eff: PLEURAL EFFUSION NOTED                                                   INFERIOR VENA CAVA                Size: DILATED                                Max Diam: 2.5 cm                                  Min Diam: 2 cm                        Percent Change: 18 %                                                                       Resp.Collapse: ABNORMAL RESPIRATORY COLLAPSE  RESTING ECHOCARDIOGRAPHIC MEASUREMENTS --------------------------------------------------- AORTA Measurements            Values    Units     Normal Range                              Aorta Sin: 3.6       cm        [2.8 - 4.0]                               Asc.Aorta: 3.8       cm        [2.2 - 3.8]                          Asc. Aorta BSA: 1.7       cm/m2     [1.1 - 1.9]                  LEFT VENTRICLE                  LVIDd: 5.5       cm        [4.2 - 5.8]                                    LVIDs: 4.4       cm        [2.5- 4]                                  LVIDd/BSA: 2.5       cm/m2                                                     SWT: 1.2       cm        [0.6 - 1]                                       PWT: 1.2       cm        [0.6 - 1]                                  LV EF 3D: 40        %                                                   LV EDV 3D: 198       mL  LV EDV 3Di: 92        mL/m2                                               LV ESV 3D: 118       mL                                                 LV ESV 3Di: 55        mL/m2                                  DIASTOLIC FUNCTION          MV Pk. E Vel.: 108.4     cm/s                                            MV Pk. A Vel.: 64.2      cm/s                                                   MV E/A: 1.7                                                               MV DT: 105       msec                                           MV Med E' Vel.: 5.1       cm/s                                              MV Med E/e': 21.1                                                      MV Lat E'Vel.: 6.9       cm/s                                              MV Lat E/e': 15.7  MV Avg E/e': 18.1      cm/s                                   LEFT ATRIUM                LA Diam: 4.6       cm        [3 - 4]                                     LA Area: 35.6      cm2       [<= 20]                                   LA Volume: 152       ml        [18 - 58]                                      LAVi: 70        ml/m2     [16 - 34]                    RIGHT VENTRICLE               RV TAPSE: 2.6       cm                                                 RV S' Vel.: 11.1      cm/s                                                  RV Base: 4.6       cm        [2.5 - 4.1]                                  RV Mid: 2.8       cm        [1.9 - 3.5]                   RIGHT ATRIUM                RA Area: 16.8      cm2       [ <= 20]                                  RA Volume: 50        mL  RAVi: 23        mL/m2     [11 - 39]                    INFERIOR VENA CAVA                Max.IVC: 2.5       cm        [ <= 2.1]                                   Min.IVC: 2         cm        [ <= 1.7]                            Percent Change: 18        %                                      Pressures, Gradients, and DOPPLER ECHO --------------------------------------------------- Mitral Valve          MV Pk. E Vel.: 108.4     cm/s                                            MV Pk. A Vel.: 64.2      cm/s                                                   MV E/A: 1.7                                                    MV Inflow E Vel.: 108.4     cm/s                                        MV Annulus E'Vel.: 5.1       cm/s                                                E/E'Ratio: 21                                               Tricuspid Regurgitation Values            TR Pk. Vel.: 2.8       m/s  RA Pressure: 15        mmHg                                                     RVSP: 46        mmHg      Peak                         3D acquisition and reconstructions were performed as part of this examination to more accurately quantify the effects of identified structural abnormalities as part of the exam with independent workstation.        Perform By: Langley Loise HEADING, RCS                                                 Res. Person: Evalene Morris, BS,  RCS                                           Electronically signed by Therisa Olam Andreas, M.D. on:02/08/2024 2:23:34 PM with status of Final The images are stored in the [facility] system, please contact the clinical provider for images related to this study.                                                                      X-ray chest single view  portable Result Date: 02/07/2024 XR CHEST SINGLE VIEW PORTABLE Indication: Lung Aeration Comparison: Chest radiographs dated 01/07/2024 Findings: Bilateral perihilar and lower lobe multisegmental opacities, new from 01/05/2024. No pneumothorax. Small bilateral pleural effusions. Stable cardiac and mediastinal contours. No acute osseous abnormality. Impression: 1.  Small left hydropneumothorax. 2.  Small right pleural effusion. 3.  Bilateral perihilar lower lobe multisegmental opacities for which the differential includes edema, infection, or aspiration. Findings were discussed with Bard Coe, MD (emergency department) by Roxie Cassis, MD (radiology) on 02/07/2024 7:11 PM Electronically Signed by:  Roxie Cassis, MD, [facility] Electronically Signed on:  02/07/2024 7:12 PM   _____________________  Code Status: DNAR Goals of care were not addressed during this admission.   Status on Discharge:  Current activity: Chairfast (02/15/24 0716) Current mobility: Slightly limited (02/15/24 0716)  Activity Recommendation: activity as tolerated  Other Discharge Instructions: Services setup at discharge: None Tubes/lines at discharge: Central dialysis line  Diet: Diet regular  Wound Care Order Instructions     None       _____________________  Time spent on discharge process: 45 minutes    Bradley TORIBIO BAMBERG, MD [facility]  02/15/2024   Hospital Contact Information:  [facility]) [facility]) [facility])  Pending tests:  Laboratory: [phone] Microbiology: [phone] Pathology: [phone] Radiology: [phone]  General questions: [phone] Pending tests: Laboratory: [phone] Microbiology: [phone] Pathology: [phone])  [phone] Radiology: [phone]  General questions:  [phone] Pending tests:  Laboratory: [phone] Microbiology: [phone] Pathology: [phone] Radiology: [phone]  General questions:  [phone] Pending tests: Laboratory: [phone] Microbiology: [phone] Pathology: [phone] Radiology: [phone]  General questions:  [phone]    ------------------------------------------------------------------------------- Attestation signed by Girtha Gordy Press, MD at 02/16/2024 11:50 AM Attending Attestation:   I was immediately available or designated on call and agree with the above written plan.    Gordy Girtha, MD [facility] Medicine [facility] 02/16/2024  -------------------------------------------------------------------------------

## 2024-02-24 ENCOUNTER — Ambulatory Visit: Payer: Self-pay

## 2024-02-24 ENCOUNTER — Telehealth: Payer: Self-pay

## 2024-02-24 ENCOUNTER — Ambulatory Visit: Admitting: Physician Assistant

## 2024-02-24 NOTE — Telephone Encounter (Signed)
 Copied from CRM [id number]. Topic: Appointments - Transfer of Care >> Feb 24, 2024 11:17 AM Hadassah PARAS wrote: Pt is requesting to transfer FROM: [facility] Pt is requesting to transfer TO: - Toribio SHAUNNA Hoyle, PA Reason for requested transfer: no longer pt at [facility] It is the responsibility of the team the patient would like to transfer to (Dr. GLENWOOD Toribio SHAUNNA Hoyle, PA) to reach out to the patient if for any reason this transfer is not acceptable.

## 2024-02-24 NOTE — Telephone Encounter (Signed)
Noted   Pt has appt today.  KP

## 2024-02-24 NOTE — Telephone Encounter (Signed)
 FYI Only or Action Required?: FYI only for provider: ED advised.  Patient was last seen in primary care on established with [facility] in [place].  Called Nurse Triage reporting Wound Infection.  Symptoms began several years ago.  Interventions attempted: Nothing.  Symptoms are: gradually worsening.  Triage Disposition: Go to ED Now (or PCP Triage)  Patient/caregiver understands and will follow disposition?: No, refuses disposition  Copied from CRM #[id number]. Topic: Clinical - Red Word Triage >> Feb 24, 2024 10:25 AM Shanda MATSU wrote: Red Word that prompted transfer to Nurse Triage: Charletta w/[facility] [place] calling with patient there to report wounds on right leg with foul odor, patient is an amputee. Reason for Disposition  Patient sounds very sick or weak to the triager  Answer Assessment - Initial Assessment Questions Speaking with HD RN, Sky at [facility]. Reports she irrigated wound. Advised ED, pt declines. Advised UC at minimum. Pt not established with [facility] and unable to notify PCP. Pt established with [facility] in [place]. Notified that pt would need to call PCP office with [facility] for further recommendations as Sky reports they could administer IV abxs with PCP orders, if pt continues to decline ED and they are needed.  1. LOCATION: Where is the wound located?      Right leg  2. WOUND APPEARANCE: What does the wound look like?      4 wounds on right leg  3. SIZE: If redness is present, ask: What is the size of the red area? (Inches, centimeters, or compare to size of a coin)      4 sores 2 cm  4. SPREAD: What's changed in the last day?  Do you see any red streaks coming from the wound?     Triage cut short  5. ONSET: When did it start to look infected?      2 years ago  6. MECHANISM: How did the wound start, what was the cause?     Triage cut short  7. PAIN: Do you have any pain?  If Yes, ask: How bad is the pain?  (e.g., Scale  1-10; mild, moderate, or severe)     No, has neuropathy  8. FEVER: Do you have a fever? If Yes, ask: What is your temperature, how was it measured, and when did it start?     No. Temp 97.7 F  9. OTHER SYMPTOMS: Do you have any other symptoms? 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 2+ pitting edema on right leg with redness and swelling, non-blanchable.  Protocols used: Wound Infection Suspected-A-AH

## 2024-03-27 DEATH — deceased
